# Patient Record
Sex: Female | Born: 2000 | Race: Black or African American | Hispanic: No | Marital: Single
Health system: Midwestern US, Community
[De-identification: ages and names within clinical notes are randomized; demographics above are authoritative.]

---

## 2018-07-19 ENCOUNTER — Other Ambulatory Visit: Payer: Self-pay | Admitting: Family

## 2018-07-19 DIAGNOSIS — N6320 Unspecified lump in the left breast, unspecified quadrant: Secondary | ICD-10-CM

## 2018-08-02 ENCOUNTER — Ambulatory Visit
Admission: RE | Admit: 2018-08-02 | Discharge: 2018-08-02 | Disposition: A | Discharge status: Home / Self Care | Payer: BLUE CROSS/BLUE SHIELD | Source: Ambulatory Visit | Attending: Family | Admitting: Family

## 2018-08-02 ENCOUNTER — Other Ambulatory Visit: Payer: Self-pay | Admitting: Family

## 2018-08-02 DIAGNOSIS — N6320 Unspecified lump in the left breast, unspecified quadrant: Secondary | ICD-10-CM

## 2019-02-01 ENCOUNTER — Other Ambulatory Visit: Payer: BLUE CROSS/BLUE SHIELD

## 2019-11-02 IMAGING — US ULTRASOUND LEFT BREAST LIMITED
1 series · 7 of 7 positions shown · non-contrast
Comparison: Previous exam(s).

CLINICAL DATA: Palpable abnormality in the MEDIAL aspect of the
LEFT breast first noted 1-2 weeks ago. Mass has not changed since
first noted. The patient reports no tenderness at the site.

EXAM:
ULTRASOUND OF THE LEFT BREAST

[Series 1: ultrasound left breast limited · 0.05mm/px · 7 of 7 slices shown]
[im 1/7]
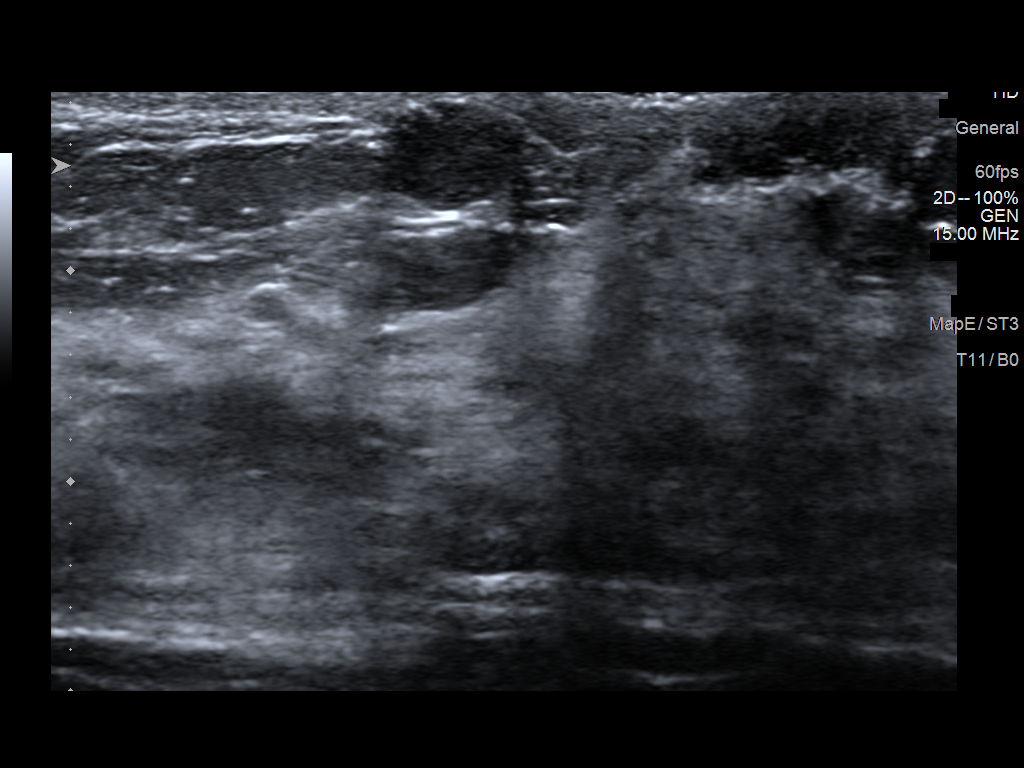
[im 2/7]
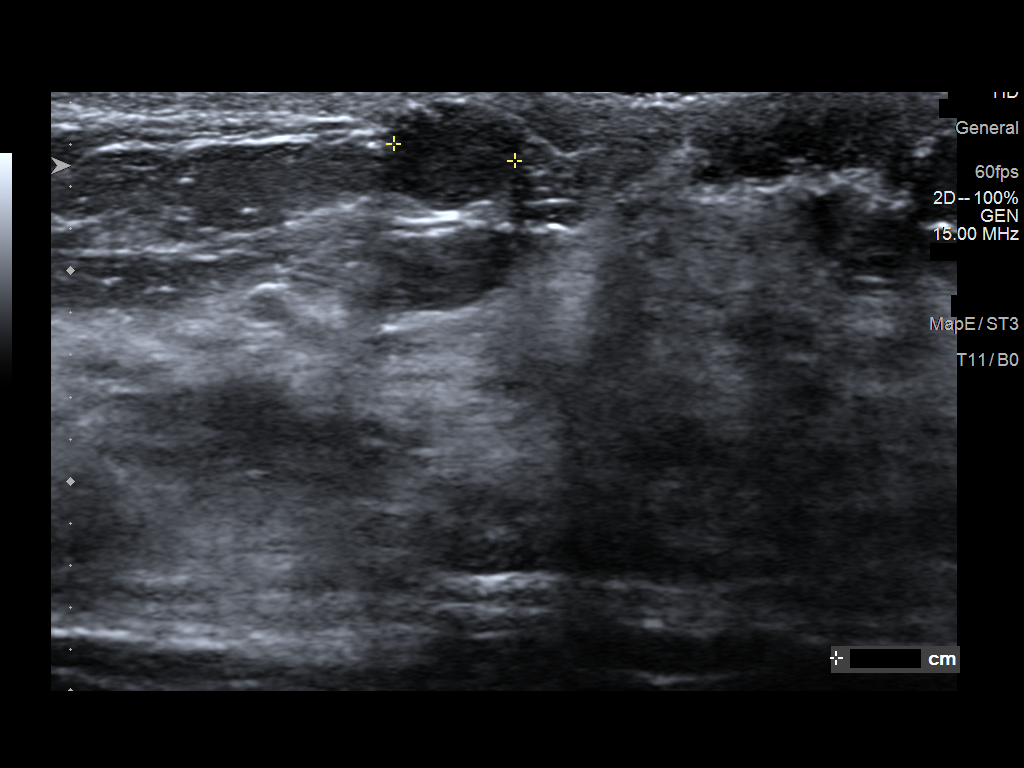
[im 3/7]
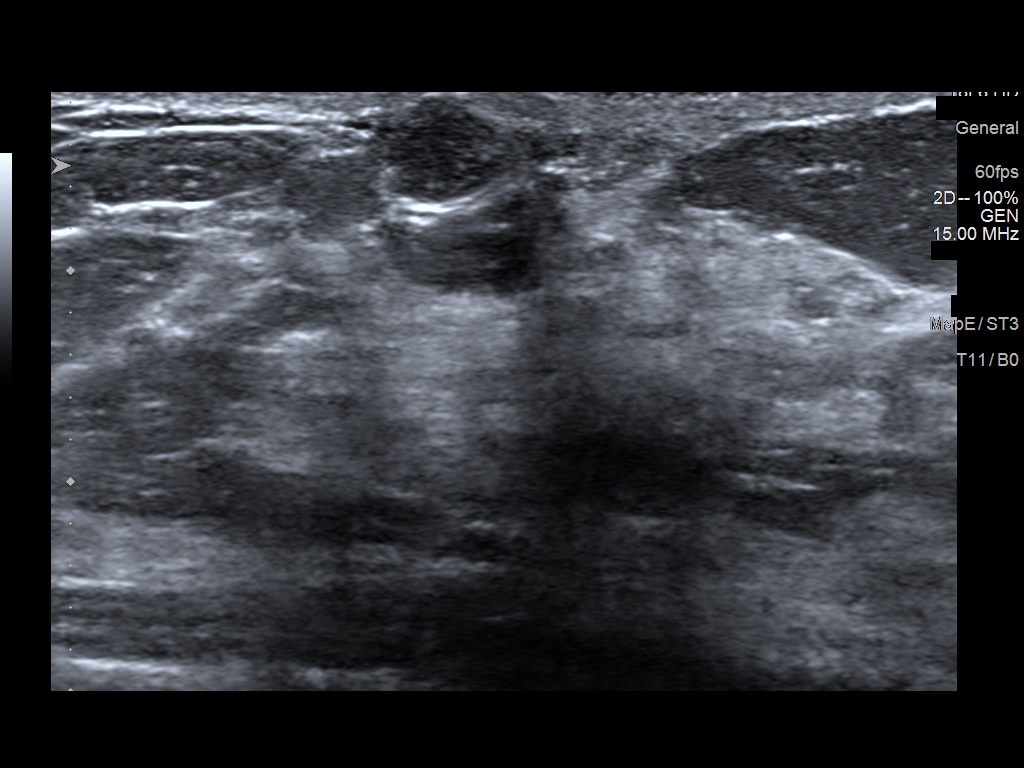
[im 4/7]
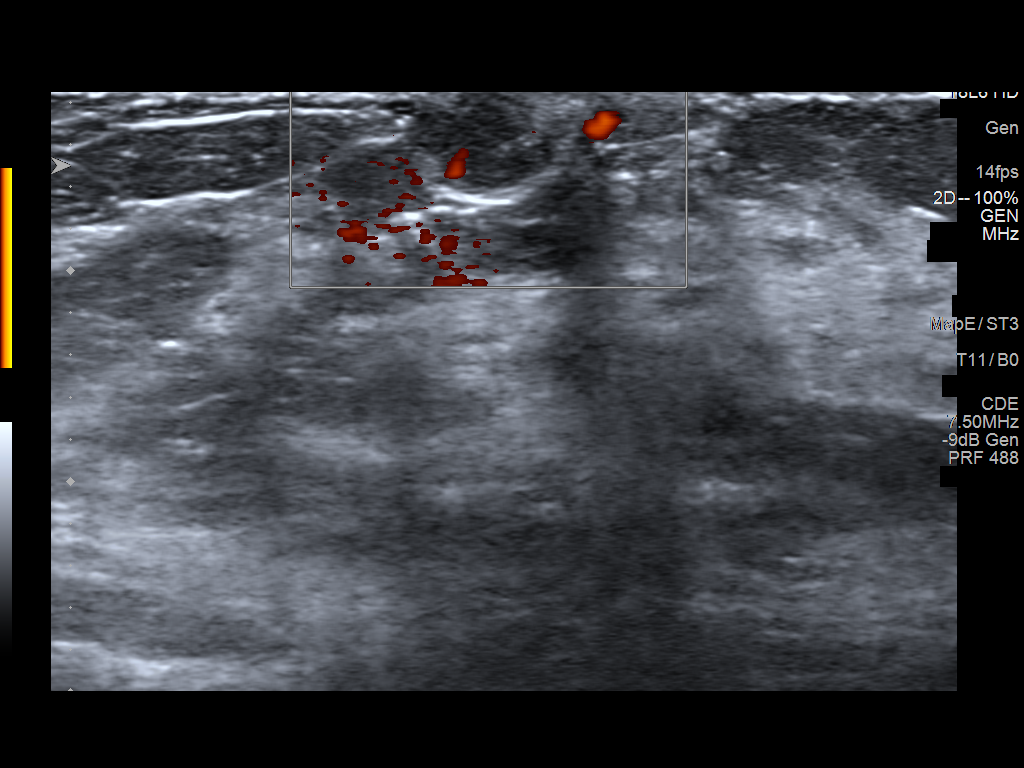
[im 5/7]
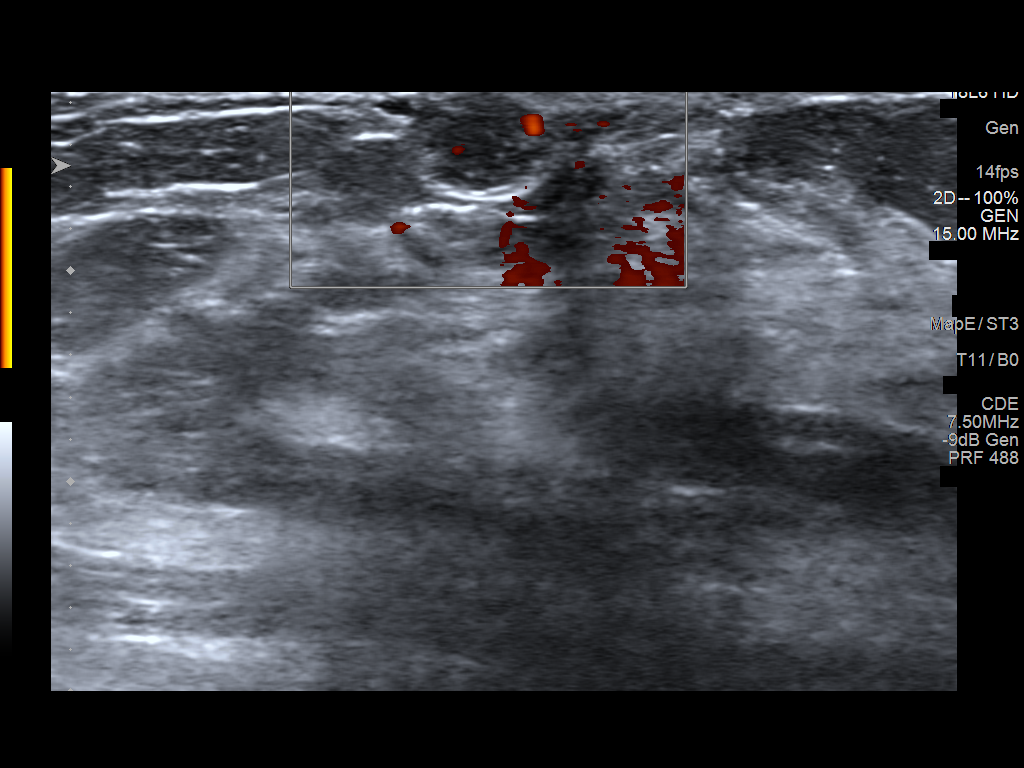
[im 6/7]
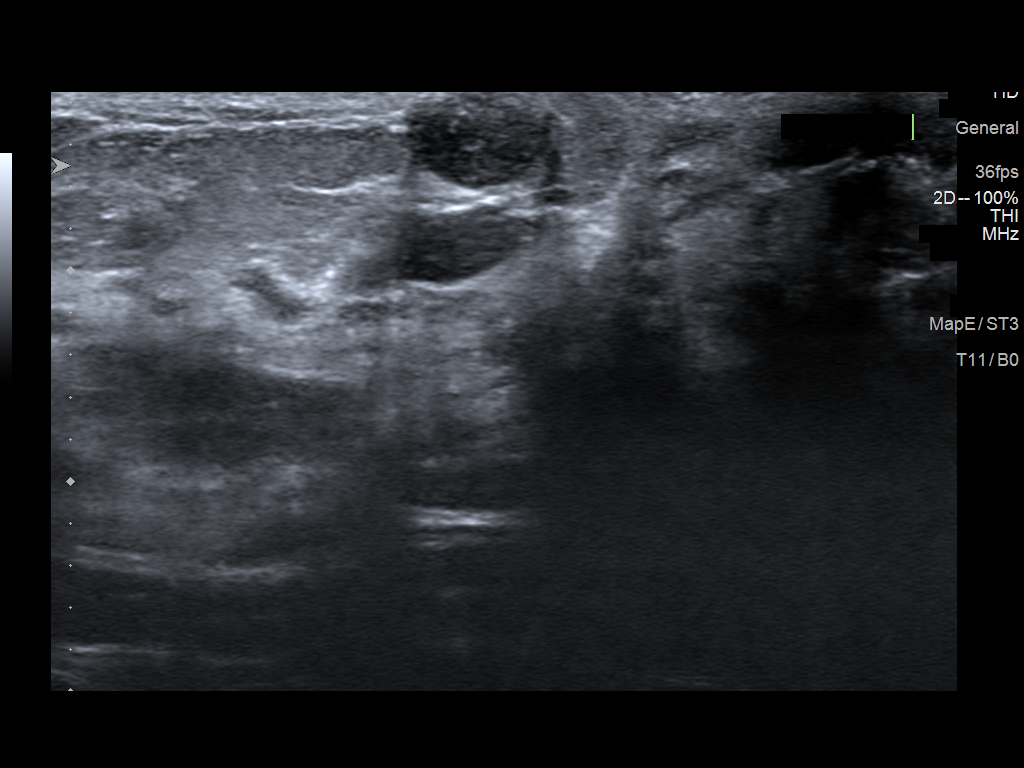
[im 7/7]
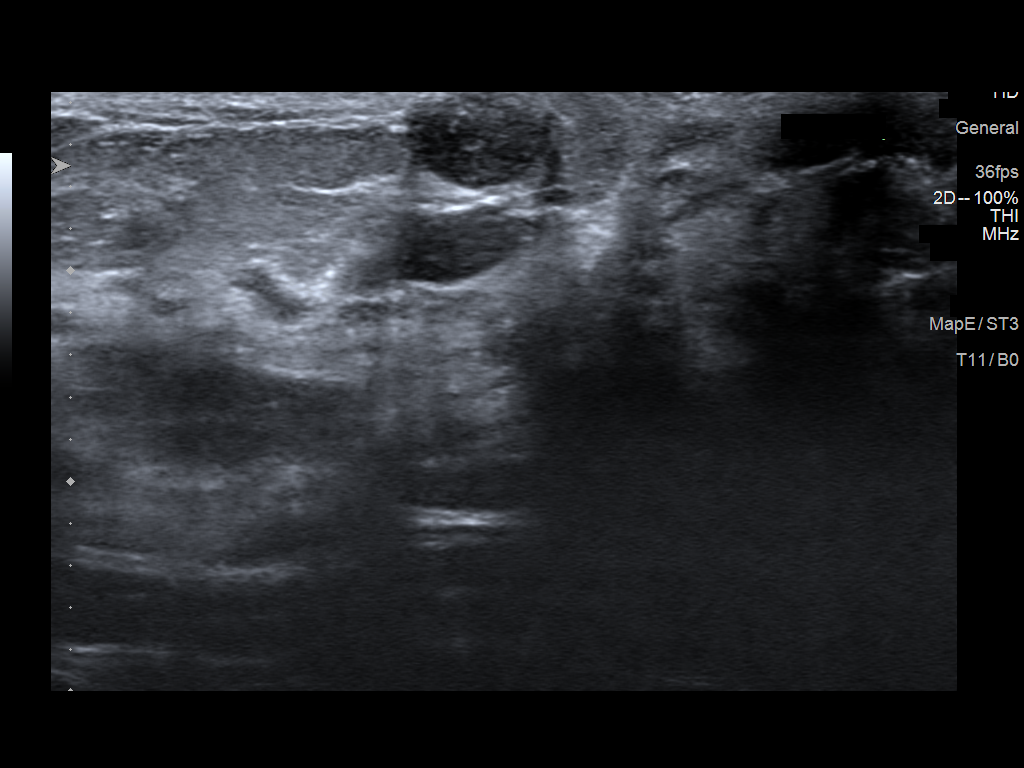

[7 of 7 positions shown; findings below may reference images not displayed]

FINDINGS: On physical exam, I palpate a discrete mobile superficial mass in
the 9:30 o'clock location of the LEFT breast, at the areolar margin.
There is no visible erythema or associated punctum.

Targeted ultrasound is performed, showing an oval hypoechoic
parallel mass in the 9:30 o'clock location LEFT breast 1 centimeter
from the nipple which measures 0.6 x 0.7 x 0.8 centimeters. Mass is
subdermal without visible sinus tract. There is internal blood flow
on Doppler evaluation.
IMPRESSION: Circumscribed, benign-appearing mass in the 9:30 o'clock location
LEFT breast. Considerations include small fibroadenoma, papilloma,
or subdermal inclusion cyst.We discussed management options
including excision, biopsy, and close follow-up. Imaging followup is
recommended at 6, 12, and 24 months to assess stability. The patient
concurs with this plan.

RECOMMENDATION:
Followup LEFT breast ultrasound in 6 months.

I have discussed the findings and recommendations with the patient.
Results were also provided in writing at the conclusion of the
visit. If applicable, a reminder letter will be sent to the patient
regarding the next appointment.

BI-RADS CATEGORY  3: Probably benign.

## 2020-03-05 ENCOUNTER — Other Ambulatory Visit: Payer: Self-pay

## 2020-03-05 ENCOUNTER — Emergency Department (HOSPITAL_COMMUNITY)
Admission: EM | Admit: 2020-03-05 | Discharge: 2020-03-05 | Disposition: A | Discharge status: Home / Self Care | Payer: BC Managed Care – PPO | Attending: Emergency Medicine | Admitting: Emergency Medicine

## 2020-03-05 ENCOUNTER — Encounter (HOSPITAL_COMMUNITY): Payer: Self-pay

## 2020-03-05 DIAGNOSIS — R1110 Vomiting, unspecified: Secondary | ICD-10-CM

## 2020-03-05 DIAGNOSIS — Z5321 Procedure and treatment not carried out due to patient leaving prior to being seen by health care provider: Secondary | ICD-10-CM | POA: Insufficient documentation

## 2020-03-05 LAB — LIPASE, BLOOD: Lipase: 31 U/L (ref 11–51)

## 2020-03-05 LAB — URINALYSIS, ROUTINE W REFLEX MICROSCOPIC
Bilirubin Urine: NEGATIVE
Glucose, UA: NEGATIVE mg/dL
Hgb urine dipstick: NEGATIVE
Ketones, ur: NEGATIVE mg/dL
Leukocytes,Ua: NEGATIVE
Nitrite: NEGATIVE
Protein, ur: NEGATIVE mg/dL
Specific Gravity, Urine: 1.021 (ref 1.005–1.030)
pH: 6 (ref 5.0–8.0)

## 2020-03-05 LAB — COMPREHENSIVE METABOLIC PANEL
ALT: 24 U/L (ref 0–44)
AST: 18 U/L (ref 15–41)
Albumin: 4.9 g/dL (ref 3.5–5.0)
Alkaline Phosphatase: 95 U/L (ref 38–126)
Anion gap: 8 (ref 5–15)
BUN: 9 mg/dL (ref 6–20)
CO2: 26 mmol/L (ref 22–32)
Calcium: 9.4 mg/dL (ref 8.9–10.3)
Chloride: 105 mmol/L (ref 98–111)
Creatinine, Ser: 0.66 mg/dL (ref 0.44–1.00)
GFR calc Af Amer: >60 mL/min (ref >60)
GFR calc non Af Amer: >60 mL/min (ref >60)
Glucose, Bld: 93 mg/dL (ref 70–99)
Potassium: 4 mmol/L (ref 3.5–5.1)
Sodium: 139 mmol/L (ref 135–145)
Total Bilirubin: 0.2 mg/dL — ABNORMAL LOW (ref 0.3–1.2)
Total Protein: 9.1 g/dL — ABNORMAL HIGH (ref 6.5–8.1)

## 2020-03-05 LAB — CBC
HCT: 42.9 % (ref 36.0–46.0)
Hemoglobin: 13 g/dL (ref 12.0–15.0)
MCH: 26.7 pg (ref 26.0–34.0)
MCHC: 30.3 g/dL (ref 30.0–36.0)
MCV: 88.3 fL (ref 80.0–100.0)
Platelets: 365 10*3/uL (ref 150–400)
RBC: 4.86 MIL/uL (ref 3.87–5.11)
RDW: 13.6 % (ref 11.5–15.5)
WBC: 6.3 10*3/uL (ref 4.0–10.5)
nRBC: 0 % (ref 0.0–0.2)

## 2020-03-05 LAB — I-STAT BETA HCG BLOOD, ED (MC, WL, AP ONLY): I-stat hCG, quantitative: <5 m[IU]/mL (ref <5)

## 2020-03-05 MED ORDER — ONDANSETRON 4 MG PO TBDP
4.0000 mg | ORAL_TABLET | Freq: Once | ORAL | Status: AC | PRN
  Administered 2020-03-05: 4 mg via ORAL
  Filled 2020-03-05: qty 1

## 2020-03-05 MED ORDER — SODIUM CHLORIDE 0.9% FLUSH: 3.0000 mL | Freq: Once | INTRAVENOUS | Status: DC

## 2020-03-05 NOTE — ED Provider Notes (Signed)
Patient eloped prior to my evaluation since apparently her symptoms improved. I did not see or treat the patient.   Dietrich Pates, PA-C 03/05/20 1823    Alvira Monday, MD 03/07/20 [phone]

## 2020-03-05 NOTE — ED Notes (Signed)
Pt states " I am leaving I have to go". Hina Pa made aware.

## 2020-03-05 NOTE — ED Notes (Signed)
PO challenge successful. No episode of emesis

## 2020-03-05 NOTE — ED Notes (Signed)
Pt given water , crackers and peanut butter for PO challenge

## 2020-03-05 NOTE — ED Notes (Signed)
Pt left by own choice provider aware. Elopement.

## 2020-03-05 NOTE — ED Triage Notes (Signed)
Patient c/o emesis since this AM. Patient denies any abdominal pain or diarrhea.

## 2021-08-28 ENCOUNTER — Encounter: Attending: Student in an Organized Health Care Education/Training Program | Primary: Internal Medicine

## 2021-09-09 ENCOUNTER — Ambulatory Visit
Admit: 2021-09-09 | Discharge: 2021-09-09 | Payer: BLUE CROSS/BLUE SHIELD | Attending: Internal Medicine | Primary: Internal Medicine

## 2021-09-09 DIAGNOSIS — Z020 Encounter for examination for admission to educational institution: Secondary | ICD-10-CM

## 2021-09-09 NOTE — Progress Notes (Signed)
Please let her know labs are fine except for non reactivity of hep b antibodies, which means she does not have immunity to hep b, so she needs to be vaccinated. She needs to receive 3 doses of hepatitis b vaccine, schedule 0, 1 and 6 months, she can ask her pharmacy to give them to her, or perhaps she can have them at her school, she can ask them. Tell her I will complete her school form with this information tomorrow.   Thanks

## 2021-09-09 NOTE — Progress Notes (Signed)
Tina Banks is a 20 y.o. female and presents with Establish Care and Physical    Selina is healthy, asymptomatic, here for physical. Brought form for nursing school.  She brought her immunization records, will be scanned in chart  Last pap last month at [facility] [place], normal.     Review of Systems  Review of Systems   Constitutional:  Negative for chills, fatigue, fever and unexpected weight change.   HENT:  Negative for congestion, ear pain, sneezing and sore throat.    Eyes:  Negative for pain and discharge.   Respiratory:  Negative for cough, shortness of breath and wheezing.    Cardiovascular:  Negative for chest pain, palpitations and leg swelling.   Gastrointestinal:  Negative for abdominal pain, blood in stool, constipation and diarrhea.   Endocrine: Negative for polydipsia and polyuria.   Genitourinary:  Negative for difficulty urinating, dysuria, frequency, hematuria and urgency.   Musculoskeletal:  Negative for arthralgias, back pain and joint swelling.   Skin:  Negative for rash.   Allergic/Immunologic: Negative for environmental allergies and food allergies.   Neurological:  Negative for dizziness, speech difficulty, weakness, light-headedness, numbness and headaches.   Hematological:  Negative for adenopathy.   Psychiatric/Behavioral:  Negative for behavioral problems (Depression), sleep disturbance and suicidal ideas.         History reviewed. No pertinent past medical history.  History reviewed. No pertinent surgical history.  Social History     Socioeconomic History    Marital status: SINGLE   Tobacco Use    Smoking status: Never    Smokeless tobacco: Never   Vaping Use    Vaping Use: Never used   Substance and Sexual Activity    Alcohol use: Yes    Drug use: Not Currently     Types: Marijuana    Sexual activity: Not Currently     Partners: Male     Birth control/protection: Condom, Abstinence     Family History   Problem Relation Age of Onset    Hypertension Father     Diabetes Father     High  Cholesterol Paternal Uncle        Not on File    Objective:  Visit Vitals  BP 123/79 (BP 1 Location: Left upper arm, BP Patient Position: Sitting, BP Cuff Size: Adult)   Pulse 93   Temp 98 ??F (36.7 ??C) (Temporal)   Resp 18   Ht 5\' 2"  (1.575 m)   Wt 132 lb (59.9 kg)   LMP 09/09/2021   BMI 24.14 kg/m??     Physical Exam:   Physical Exam  Constitutional:       General: She is not in acute distress.     Appearance: Normal appearance.   HENT:      Head: Normocephalic and atraumatic.      Mouth/Throat:      Mouth: Mucous membranes are moist.   Eyes:      Extraocular Movements: Extraocular movements intact.      Conjunctiva/sclera: Conjunctivae normal.      Pupils: Pupils are equal, round, and reactive to light.   Cardiovascular:      Rate and Rhythm: Normal rate and regular rhythm.      Pulses: Normal pulses.      Heart sounds: Normal heart sounds.   Pulmonary:      Effort: Pulmonary effort is normal.      Breath sounds: Normal breath sounds.   Abdominal:      General: Abdomen is flat.  Bowel sounds are normal. There is no distension.      Palpations: Abdomen is soft. There is no mass.      Tenderness: There is no abdominal tenderness.   Musculoskeletal:         General: No swelling or deformity.      Cervical back: Normal range of motion and neck supple.      Right lower leg: No edema.      Left lower leg: No edema.   Lymphadenopathy:      Cervical: No cervical adenopathy.   Skin:     General: Skin is warm and dry.      Capillary Refill: Capillary refill takes less than 2 seconds.      Coloration: Skin is not jaundiced or pale.      Findings: No erythema or rash.   Neurological:      General: No focal deficit present.      Mental Status: She is alert and oriented to person, place, and time.   Psychiatric:         Mood and Affect: Mood normal.         Behavior: Behavior normal.         Thought Content: Thought content normal.         Judgment: Judgment normal.        No results found for this or any previous  visit.    Assessment/Plan:    ICD-10-CM ICD-9-CM    1. School physical exam  Z02.0 V70.5 CBC WITH AUTOMATED DIFF      METABOLIC PANEL, COMPREHENSIVE      2. Immunity status testing  Z01.84 V72.61 HEP B SURFACE AB      MEASLES/MUMPS/RUBELLA IMMUNITY      VZV AB, IGG      3. Need for hepatitis C screening test  Z11.59 V73.89 HEPATITIS C AB        Orders Placed This Encounter    CBC WITH AUTOMATED DIFF    METABOLIC PANEL, COMPREHENSIVE    HEPATITIS C AB    HEP B SURFACE AB    MEASLES/MUMPS/RUBELLA IMMUNITY    VARICELLA-ZOSTER V AB, IGG     routine labs ordered, call if any problems  There are no Patient Instructions on file for this visit.   Follow-up and Dispositions    Return in about 1 year (around 09/09/2022) for physical/wellness.

## 2021-09-09 NOTE — Progress Notes (Signed)
1. "Have you been to the ER, urgent care clinic since your last visit?  Hospitalized since your last visit?" No    2. "Have you seen or consulted any other health care providers outside of the [facility] since your last visit?" No     3. For patients aged 22-75: Has the patient had a colonoscopy / FIT/ Cologuard? NA - based on age      If the patient is female:    4. For patients aged 8-74: Has the patient had a mammogram within the past 2 years? NA - based on age or sex      36. For patients aged 21-65: Has the patient had a pap smear? Yes - no Care Gap present  10/22 [facility]       Chief Complaint   Patient presents with    Establish Care    Physical        Visit Vitals  BP 123/79 (BP 1 Location: Left upper arm, BP Patient Position: Sitting, BP Cuff Size: Adult)   Pulse 93   Temp 98 F (36.7 C) (Temporal)   Resp 18   Ht 5\' 2"  (1.575 m)   Wt 132 lb (59.9 kg)   LMP 09/09/2021   BMI 24.14 kg/m

## 2021-09-11 LAB — CBC WITH AUTOMATED DIFF
ABS. BASOPHILS: 0 10*3/uL (ref 0.0–0.2)
ABS. EOSINOPHILS: 0.2 10*3/uL (ref 0.0–0.4)
ABS. IMM. GRANS.: 0 10*3/uL (ref 0.0–0.1)
ABS. MONOCYTES: 0.5 10*3/uL (ref 0.1–0.9)
ABS. NEUTROPHILS: 2.3 10*3/uL (ref 1.4–7.0)
Abs Lymphocytes: 2.1 10*3/uL (ref 0.7–3.1)
BASOPHILS: 1 %
EOSINOPHILS: 4 %
HCT: 36 % (ref 34.0–46.6)
HGB: 11.4 g/dL (ref 11.1–15.9)
IMMATURE GRANULOCYTES: 0 %
Lymphocytes: 41 %
MCH: 25.9 pg — ABNORMAL LOW (ref 26.6–33.0)
MCHC: 31.7 g/dL (ref 31.5–35.7)
MCV: 82 fL (ref 79–97)
MONOCYTES: 10 %
NEUTROPHILS: 44 %
PLATELET: 382 10*3/uL (ref 150–450)
RBC: 4.41 x10E6/uL (ref 3.77–5.28)
RDW: 15.6 % — ABNORMAL HIGH (ref 11.7–15.4)
WBC: 5.1 10*3/uL (ref 3.4–10.8)

## 2021-09-11 LAB — HEPATITIS C AB: Hep C Virus Ab: 0.2 s/co ratio (ref 0.0–0.9)

## 2021-09-11 LAB — MEASLES/MUMPS/RUBELLA IMMUNITY
MUMPS ABS, IGG: 190 AU/mL (ref >10.9)
Mumps Abs, IgG: 190 AU/mL (ref >10.9)
RUBELLA ANTIBODIES, IGG, 006201: 2.63 index (ref >0.99)
Rubella Ab, IgG: 2.63 index (ref >0.99)
Rubeola Ab, IgG: 45.2 AU/mL (ref >16.4)
Rubeola Antibody, IgG: 45.2 AU/mL (ref >16.4)

## 2021-09-11 LAB — METABOLIC PANEL, COMPREHENSIVE
A-G Ratio: 1.7 (ref 1.2–2.2)
ALT (SGPT): 9 IU/L (ref 0–32)
AST (SGOT): 13 IU/L (ref 0–40)
Albumin: 4.8 g/dL (ref 3.9–5.0)
Alk. phosphatase: 84 IU/L (ref 44–121)
BUN/Creatinine ratio: 8 — ABNORMAL LOW (ref 9–23)
BUN: 5 mg/dL — ABNORMAL LOW (ref 6–20)
Bilirubin, total: <0.2 mg/dL (ref 0.0–1.2)
CO2: 21 mmol/L (ref 20–29)
Calcium: 9.8 mg/dL (ref 8.7–10.2)
Chloride: 106 mmol/L (ref 96–106)
Creatinine: 0.65 mg/dL (ref 0.57–1.00)
GLOBULIN, TOTAL: 2.9 g/dL (ref 1.5–4.5)
Glucose: 90 mg/dL (ref 70–99)
Potassium: 4.4 mmol/L (ref 3.5–5.2)
Protein, total: 7.7 g/dL (ref 6.0–8.5)
Sodium: 142 mmol/L (ref 134–144)
eGFR: 128 mL/min/{1.73_m2} (ref >59)

## 2021-09-11 LAB — VZV AB, IGG
VARICELLA ZOSTER IGG, 096209: 402 index (ref >165)
VARICELLA ZOSTER IGG: 402 index (ref >165)

## 2021-09-11 LAB — HEP B SURFACE AB: HEP B SURFACE AB, QUAL: NONREACTIVE

## 2021-09-11 LAB — CBC WITH AUTO DIFFERENTIAL
Basophils %: 1 %
Basophils Absolute: 0 10*3/uL (ref 0.0–0.2)
Eosinophils %: 4 %
Eosinophils Absolute: 0.2 10*3/uL (ref 0.0–0.4)
Granulocyte Absolute Count: 0 10*3/uL (ref 0.0–0.1)
Hematocrit: 36 % (ref 34.0–46.6)
Hemoglobin: 11.4 g/dL (ref 11.1–15.9)
Immature Granulocytes: 0 %
Lymphocytes %: 41 %
Lymphocytes Absolute: 2.1 10*3/uL (ref 0.7–3.1)
MCH: 25.9 pg — ABNORMAL LOW (ref 26.6–33.0)
MCHC: 31.7 g/dL (ref 31.5–35.7)
MCV: 82 fL (ref 79–97)
Monocytes %: 10 %
Monocytes Absolute: 0.5 10*3/uL (ref 0.1–0.9)
Neutrophils %: 44 %
Neutrophils Absolute: 2.3 10*3/uL (ref 1.4–7.0)
Platelets: 382 10*3/uL (ref 150–450)
RBC: 4.41 x10E6/uL (ref 3.77–5.28)
RDW: 15.6 % — ABNORMAL HIGH (ref 11.7–15.4)
WBC: 5.1 10*3/uL (ref 3.4–10.8)

## 2021-09-11 LAB — HEPATITIS C ANTIBODY: HCV Ab: 0.2 s/co ratio (ref 0.0–0.9)

## 2021-09-11 LAB — COMPREHENSIVE METABOLIC PANEL
ALT: 9 IU/L (ref 0–32)
AST: 13 IU/L (ref 0–40)
Albumin/Globulin Ratio: 1.7 NA (ref 1.2–2.2)
Albumin: 4.8 g/dL (ref 3.9–5.0)
Alkaline Phosphatase: 84 IU/L (ref 44–121)
BUN: 5 mg/dL — ABNORMAL LOW (ref 6–20)
Bun/Cre Ratio: 8 NA — ABNORMAL LOW (ref 9–23)
CO2: 21 mmol/L (ref 20–29)
Calcium: 9.8 mg/dL (ref 8.7–10.2)
Chloride: 106 mmol/L (ref 96–106)
Creatinine: 0.65 mg/dL (ref 0.57–1.00)
Est, Glomerular Filtration Rate: 128 mL/min/{1.73_m2} (ref >59)
Globulin, Total: 2.9 g/dL (ref 1.5–4.5)
Glucose: 90 mg/dL (ref 70–99)
Potassium: 4.4 mmol/L (ref 3.5–5.2)
Sodium: 142 mmol/L (ref 134–144)
Total Bilirubin: <0.2 mg/dL (ref 0.0–1.2)
Total Protein: 7.7 g/dL (ref 6.0–8.5)

## 2021-09-11 LAB — HEPATITIS B SURFACE ANTIBODY: HEP B SURFACE AB, QUAL, 006408: NONREACTIVE

## 2021-09-18 NOTE — Telephone Encounter (Signed)
Called and informed pt of her lab results and also informed her that Dr. Gaynelle Cage will have her school forms completed by the end of the business day. Pt expressed understanding  ----- Message from [facility] Linward Natal, MD sent at 09/17/2021 10:17 PM EST -----  Please let her know labs are fine except for non reactivity of hep b antibodies, which means she does not have immunity to hep b, so she needs to be vaccinated. She needs to receive 3 doses of hepatitis b vaccine, schedule 0, 1 and 6 months, she can ask her pharmacy to give them to her, or perhaps she can have them at her school, she can ask them. Tell her I will complete her school form with this information tomorrow.   Thanks

## 2021-09-28 NOTE — Telephone Encounter (Signed)
Patient called and stated that she gave her papers to fill out and [place] does have the papers and is filling them out. 09-28-21

## 2022-11-19 NOTE — Telephone Encounter (Signed)
Called and spoke with patient about appointment and informed her that Dr. Renaldo Reel is no longer with this practice. That she would have to chose a new provider. She asked when the next available appt was. I told her that the next available was May. She stated that she wanted something sooner. So I suggested [facility] which would have sooner appointments. She stated she was fine with that.

## 2022-11-19 NOTE — Telephone Encounter (Signed)
-----   Message from Nori Riis sent at 10/29/2022 11:38 AM EST -----  Subject: Appointment Request    Reason for Call: Established Patient Appointment needed: Routine Physical   Exam with PAP    QUESTIONS    Reason for appointment request? No appointments available during search     Additional Information for Provider? Patient needs a pap and pe appt and   wants some seperate testing done too. call her to schedule.   ---------------------------------------------------------------------------  --------------  Rod Can INFO  [phone]; OK to leave message on voicemail  ---------------------------------------------------------------------------  --------------  SCRIPT ANSWERS
